# Patient Record
Sex: Female | Born: 1970 | Race: White | Hispanic: No | Marital: Married | Smoking: Never smoker
Health system: Southern US, Community
[De-identification: ages and names within clinical notes are randomized; demographics above are authoritative.]

## PROBLEM LIST (undated history)

## (undated) DIAGNOSIS — Z789 Other specified health status: Secondary | ICD-10-CM

## (undated) HISTORY — PX: ABDOMINAL HYSTERECTOMY: SHX81

---

## 2008-02-21 ENCOUNTER — Inpatient Hospital Stay (HOSPITAL_COMMUNITY): Admission: RE | Admit: 2008-02-21 | Discharge: 2008-02-23 | Payer: Self-pay | Admitting: Obstetrics and Gynecology

## 2008-02-21 ENCOUNTER — Encounter (INDEPENDENT_AMBULATORY_CARE_PROVIDER_SITE_OTHER): Payer: Self-pay | Admitting: Obstetrics and Gynecology

## 2010-03-05 ENCOUNTER — Ambulatory Visit (HOSPITAL_COMMUNITY)
Admission: RE | Admit: 2010-03-05 | Discharge: 2010-03-06 | Payer: Self-pay | Source: Home / Self Care | Admitting: Obstetrics and Gynecology

## 2010-03-05 ENCOUNTER — Encounter (INDEPENDENT_AMBULATORY_CARE_PROVIDER_SITE_OTHER): Payer: Self-pay | Admitting: Obstetrics and Gynecology

## 2010-08-21 LAB — CBC
HCT: 30.6 % — ABNORMAL LOW (ref 36.0–46.0)
HCT: 38.2 % (ref 36.0–46.0)
Hemoglobin: 13.2 g/dL (ref 12.0–15.0)
MCH: 32.5 pg (ref 26.0–34.0)
MCHC: 34.3 g/dL (ref 30.0–36.0)
MCHC: 34.6 g/dL (ref 30.0–36.0)
MCV: 95.3 fL (ref 78.0–100.0)
RDW: 12.6 % (ref 11.5–15.5)
RDW: 12.6 % (ref 11.5–15.5)

## 2010-08-21 LAB — COMPREHENSIVE METABOLIC PANEL
BUN: 3 mg/dL — ABNORMAL LOW (ref 6–23)
Calcium: 8.5 mg/dL (ref 8.4–10.5)
Glucose, Bld: 95 mg/dL (ref 70–99)
Total Protein: 5.6 g/dL — ABNORMAL LOW (ref 6.0–8.3)

## 2010-08-21 LAB — SURGICAL PCR SCREEN: Staphylococcus aureus: POSITIVE — AB

## 2010-08-21 LAB — PREGNANCY, URINE: Preg Test, Ur: NEGATIVE

## 2010-10-21 NOTE — Op Note (Signed)
NAMEERNEST, Sherri White NO.:  [id number]   MEDICAL RECORD NO.:  [id number]          PATIENT TYPE:  INP   LOCATION:  9148                          [facility]ILITY:  [facility]   PHYSICIAN:  Randye Lobo, M.D.   DATE OF BIRTH:  19-Jul-1970   DATE OF PROCEDURE:  02/21/2008  DATE OF DISCHARGE:                               OPERATIVE REPORT   PREOPERATIVE DIAGNOSES:  1. Intrauterine gestation at 24 plus 1 weeks.  2. History of prior cesarean section x2.  3. Multiparous female, desires permanent sterilization.   POSTOPERATIVE DIAGNOSES:  1. Intrauterine gestation at 83 plus 1 weeks.  2. History of prior cesarean section x2.  3. Multiparous female, desires permanent sterilization.   PROCEDURE:  Repeat low segment transverse cesarean section, bilateral  tubal ligation by the modified Pomeroy technique.   SURGEON:  Randye Lobo, MD   ASSISTANT:  Gretchen Short, PA-C   ANESTHESIA:  Spinal.   IV FLUIDS:  2100 mL Ringer's lactate.   ESTIMATED BLOOD LOSS:  600 mL.   URINE OUTPUT:  150 mL.   COMPLICATIONS:  None.   INDICATIONS FOR PROCEDURE:  The patient is a 40 year old gravida 3, para  2-0-0-2 Caucasian female, status post cesarean section x2, who  throughout her current pregnancy has requested a repeat cesarean section  and permanent sterilization.  The patient now presents at 39 plus 1  weeks and a plan is made to proceed.  Risks, benefits, and alternatives  have been discussed with the patient.  The patient was quoted a failure  rate of the tubal ligation of approximately 1 in 250 to 1 in 300 which  may result in either an intrauterine or ectopic pregnancy.   FINDINGS:  A viable female was delivered at 9:56 a.m.  Apgars were 9 at  1 minute and 9 at 5 minutes.  Weight was 7 pounds 14 ounces.  Amniotic  fluid clear.  Uterus, tubes and ovaries were normal.   SPECIMENS:  Portion of the right and left fallopian tubes were sent to  pathology.   PROCEDURE:  The patient was  reidentified in the preoperative hold area.  She did receive Ancef 1 g IV for antibiotic prophylaxis.   In the operating room, a spinal anesthetic was administered.  The  abdomen was then sterilely prepped and a Foley catheter was placed  inside the bladder.  She was sterilely draped.   A Pfannenstiel incision was created sharply with a scalpel.  The  incision was carried down to the fascia using a scalpel.  The fascia was  incised in the midline with a scalpel and the incision was extended with  the Mayo scissors.  The rectus muscles were then dissected off of the  fascia superiorly and inferiorly using sharp dissection.  The rectus  muscles were divided in the midline.  Entry directly into the peritoneal  cavity was performed with the separation of the rectus muscles, which  were densely adherent to the peritoneum.  A care was taken to avoid the  bladder as the peritoneal incision was extended inferiorly.  The lower uterine segment was exposed and the bladder flap was sharply  created.  A transverse lower uterine segment incision was then created  sharply with a scalpel and the incision was extended bluntly.  A hand  was inserted through the uterine incision and the vertex was noted to be  high.  Fundal pressure was applied which brought the vertex to the  uterine incision.  The Mityvac was used with proper pressure over 1  effort and the vertex was delivered without difficulty.  The vacuum was  released.  The nares and mouth were suctioned and the remainder of the  newborn was delivered.  The newborn was in vigorous condition.  The  umbilical cord was doubly clamped and cut and the newborn was carried  over to the awaiting pediatricians.   The placenta was manually extracted at this time and was set aside for  cord blood collection and donation.  The uterus was exteriorized.  It  was wiped, clean with a moistened lap pad.  The uterine incision was  closed with a double layer  closure of #1 chromic.  The first was a  running locked layer and the second was an imbricating layer.   The bilateral tubal ligation was performed next.  The right fallopian  tube was grasped along the isthmic portion.  Two free ties of 0 plain  gut suture were placed at the base of loop of fallopian tube.  The  intervening portion was sharply excised and sent to pathology.  The same  procedure that was performed on the right fallopian tube was repeated on  the left fallopian tube after that tube was similarly followed all the  way to its fimbriated end.  Again a portion of the left fallopian tube  was sent to pathology.  The tubal sites were examined and were noted to  ooze very slightly along the serosa of the tube which was cauterized for  excellent hemostasis.   The uterus was carefully returned to the peritoneal cavity.  The tubal  sites were intact.  The perineal cavity was then irrigated and  suctioned.  The uterine incision was hemostatic and the abdomen was  therefore closed.  Omental adhesions to the left anterior abdominal wall  peritoneum were lysed using monopolar cautery.  The parietal peritoneum  was closed with a running suture of 2-0 Vicryl.  The rectus muscles were  reapproximated in the midline with interrupted sutures of #1 chromic.  The fascia was then closed with a running suture of 0 Vicryl.  The  subcutaneous layer was irrigated and suctioned and made hemostatic with  monopolar cautery.  The subcutaneous layer was incised with the  monopolar cautery in the subcutaneous layer in order to decrease tension  on closure of the skin.  The skin was then closed with staples and  sterile dressing was placed over the incision.   This concluded the patient's procedure.  There were no complications.  All needle, instrument, and sponge counts were correct.  The patient was  escorted to the recovery room in stable and awake condition.      Randye Lobo, M.D.   Electronically Signed    BES/MEDQ  D:  02/21/2008  T:  02/21/2008  Job:  [id number]

## 2010-10-24 NOTE — Discharge Summary (Signed)
Sherri White, NOA NO.:  [id number]   MEDICAL RECORD NO.:  [id number]          PATIENT TYPE:  INP   LOCATION:  9148                          [facility]ILITY:  [facility]   PHYSICIAN:  Kendra H. Tenny Craw, MD     DATE OF BIRTH:  Aug 22, 1970   DATE OF ADMISSION:  02/21/2008  DATE OF DISCHARGE:  02/23/2008                               DISCHARGE SUMMARY   FINAL DIAGNOSES:  Intrauterine pregnancy at 37 weeks' gestation, history  of prior cesarean section x2, multiparous female desiring permanent  sterilization.   PROCEDURE:  Repeat low transverse cesarean section and bilateral tubal  ligation using the modified Pomeroy technique.   SURGEON:  Randye Lobo, MD   ASSISTANT:  Gretchen Short, PA-C   COMPLICATIONS:  None.   This 40 year old G3, P 2-0-0-2 presents at term for repeat cesarean  section.  The patient had had a prior cesarean section with her last 2  deliveries and desires repeat with this pregnancy as well.  The  patient's antepartum course otherwise up to this point has been  uncomplicated.  The patient has advanced maternal age and did have a  normal first trimester screen at the beginning of this pregnancy.  She  did decline amniocentesis.  The patient also has a known history of HSV-  2.   HOSPITAL COURSE:  She presents at 50 weeks' gestation, expresses her  desires for permanent sterilization to be performed after cesarean  section and risks and benefits  were discussed with the patient.  The  patient was taken to the operating room on February 21, 2008, by Dr.  Conley Simmonds where a repeat low segment transverse cesarean section was  performed with the delivery of a 7-pound 14-ounce female infant with  Apgars of 9 and 9.  Delivery went without complications.  The patient  still expressed her desire for permanent sterilization, which was  performed using the modified Pomeroy technique.  The procedure went  without complications.  The patient's postoperative course  was benign  without any significant fevers.  She was felt ready for discharge on  postoperative day #2, was sent home on a regular diet, told to decrease  activities, told to continue her vitamins, was given Percocet 1-2 every  4-6 hours as needed for pain, was to follow up the next day for her  staple removal.  Instructions and precautions were reviewed with the  patient.   The patient was also positive group B strep noted on her 35-week culture  and therefore, the patient did have a g of Ancef prior to her cesarean  section.   LABORATORY DATA:  On discharge, the patient had a hemoglobin of 12.0,  white blood cell count was 13.3, and platelets of 186,000.      Leilani Able, P.A.-C.      Freddrick March. Tenny Craw, MD  Electronically Signed    MB/MEDQ  D:  03/27/2008  T:  03/28/2008  Job:  [id number]

## 2011-03-09 LAB — URINALYSIS, ROUTINE W REFLEX MICROSCOPIC
Hgb urine dipstick: NEGATIVE
Nitrite: NEGATIVE
Specific Gravity, Urine: 1.02
pH: 6.5

## 2011-03-09 LAB — CBC
HCT: 40.5
Hemoglobin: 13.8
MCHC: 34
MCV: 97.2
RBC: 3.57 — ABNORMAL LOW
RBC: 4.16
WBC: 8

## 2011-03-09 LAB — URINE MICROSCOPIC-ADD ON

## 2013-09-29 ENCOUNTER — Other Ambulatory Visit: Payer: Self-pay | Admitting: Obstetrics and Gynecology

## 2013-10-03 ENCOUNTER — Other Ambulatory Visit: Payer: Self-pay | Admitting: Obstetrics and Gynecology

## 2013-10-03 DIAGNOSIS — R928 Other abnormal and inconclusive findings on diagnostic imaging of breast: Secondary | ICD-10-CM

## 2013-10-13 ENCOUNTER — Ambulatory Visit
Admission: RE | Admit: 2013-10-13 | Discharge: 2013-10-13 | Disposition: A | Discharge status: Home / Self Care | Payer: 59 | Source: Ambulatory Visit | Attending: Obstetrics and Gynecology | Admitting: Obstetrics and Gynecology

## 2013-10-13 DIAGNOSIS — R928 Other abnormal and inconclusive findings on diagnostic imaging of breast: Secondary | ICD-10-CM

## 2019-02-15 ENCOUNTER — Other Ambulatory Visit: Payer: Self-pay | Admitting: Neurosurgery

## 2019-02-17 ENCOUNTER — Other Ambulatory Visit: Payer: Self-pay

## 2019-02-17 ENCOUNTER — Other Ambulatory Visit (HOSPITAL_COMMUNITY)
Admission: RE | Admit: 2019-02-17 | Discharge: 2019-02-17 | Disposition: A | Discharge status: Home / Self Care | Payer: 59 | Source: Ambulatory Visit | Attending: Neurosurgery | Admitting: Neurosurgery

## 2019-02-17 DIAGNOSIS — Z20828 Contact with and (suspected) exposure to other viral communicable diseases: Secondary | ICD-10-CM | POA: Insufficient documentation

## 2019-02-17 DIAGNOSIS — Z01812 Encounter for preprocedural laboratory examination: Secondary | ICD-10-CM | POA: Diagnosis not present

## 2019-02-17 DIAGNOSIS — M5442 Lumbago with sciatica, left side: Secondary | ICD-10-CM | POA: Diagnosis not present

## 2019-02-18 LAB — NOVEL CORONAVIRUS, NAA (HOSP ORDER, SEND-OUT TO REF LAB; TAT 18-24 HRS): SARS-CoV-2, NAA: NOT DETECTED

## 2019-02-19 ENCOUNTER — Other Ambulatory Visit: Payer: Self-pay

## 2019-02-19 ENCOUNTER — Encounter (HOSPITAL_COMMUNITY): Payer: Self-pay | Admitting: *Deleted

## 2019-02-19 NOTE — Progress Notes (Signed)
Pre-op phone call complete.  PCP is Ronny Flurry, FNP in [employer].   Denies fever, CP, SOB, cough, loss taste/smell, no cardiologist.  Instructed NPO after MN, no lotions/deododorant/perfume, no valuable, no jewelry, no shaving 48 hours prior to surgery, no vitamins/NSAIDS/fish oil.  Verbalized understanding of instructions, all questions answered.

## 2019-02-20 ENCOUNTER — Other Ambulatory Visit: Payer: Self-pay | Admitting: Neurosurgery

## 2019-02-20 NOTE — H&P (Signed)
Chief Complaint   Back pain, left leg pain  HPI   HPI: Sherri White is a 48 y.o. female with several month history of back and left leg pain. She underwent an MRI of her lumbar spine which revealed a large left eccentric disc herniation at L4-5. Given severity of pain and interference with ADLs, it was recommended she undergo surgical intervention. She presents today for microdiscectomy. She is without any concerns.  There are no active problems to display for this patient.   PMH: Past Medical History:  Diagnosis Date  . Medical history non-contributory     PSH: Past Surgical History:  Procedure Laterality Date  . ABDOMINAL HYSTERECTOMY    . CESAREAN SECTION     x3    No medications prior to admission.    SH: Social History   Tobacco Use  . Smoking status: Never Smoker  . Smokeless tobacco: Never Used  Substance Use Topics  . Alcohol use: Yes    Comment: occasional  . Drug use: Never    MEDS: Prior to Admission medications   Medication Sig Start Date End Date Taking? Authorizing Provider  acetaminophen (TYLENOL) 500 MG tablet Take 1,000 mg by mouth every 8 (eight) hours as needed for moderate pain.   Yes [provider]  baclofen (LIORESAL) 10 MG tablet Take 10 mg by mouth 3 (three) times daily.   Yes [provider]  diclofenac (VOLTAREN) 75 MG EC tablet Take 75 mg by mouth 2 (two) times daily.   Yes [provider]  magnesium oxide (MAG-OX) 400 MG tablet Take 400 mg by mouth daily.   Yes [provider]    ALLERGY: No Known Allergies  Social History   Tobacco Use  . Smoking status: Never Smoker  . Smokeless tobacco: Never Used  Substance Use Topics  . Alcohol use: Yes    Comment: occasional     History reviewed. No pertinent family history.   ROS   ROS  Exam   There were no vitals filed for this visit. General appearance: WDWN, NAD Eyes: No scleral injection Cardiovascular: Regular rate and rhythm  without murmurs, rubs, gallops. No edema or variciosities. Distal pulses normal. Pulmonary: Effort normal, non-labored breathing Musculoskeletal:     Muscle tone upper extremities: Normal    Muscle tone lower extremities: Normal    Motor exam: Upper Extremities Deltoid Bicep Tricep Grip  Right 5/5 5/5 5/5 5/5  Left 5/5 5/5 5/5 5/5   Lower Extremity IP Quad PF DF EHL  Right 5/5 5/5 5/5 5/5 5/5  Left 5/5 5/5 5/5 5/5 5/5   Neurological Mental Status:    - Patient is awake, alert, oriented to person, place, month, year, and situation    - Patient is able to give a clear and coherent history.    - No signs of aphasia or neglect Cranial Nerves    - II: Visual Fields are full. PERRL    - III/IV/VI: EOMI without ptosis or diploplia.     - V: Facial sensation is grossly normal    - VII: Facial movement is symmetric.     - VIII: hearing is intact to voice    - X: Uvula elevates symmetrically    - XI: Shoulder shrug is symmetric.    - XII: tongue is midline without atrophy or fasciculations.  Sensory: Sensation grossly intact to LT  Results - Imaging/Labs   No results found for this or any previous visit (from the past 48 hour(s)).  No results found.  IMAGING: MRI of the lumbar spine dated 02/09/2019 was reviewed.  This demonstrates primary finding at L4-5 where there is a left eccentric relatively large disc herniation with likely compression of the traversing left L5 nerve root.  Impression/Plan   48 y.o. female with severe progressive left leg pain over the last 2 months secondary to a  large left eccentric disc herniation at L4-5.  we will proceed  with left-sided laminotomy and microdiskectomy at L4-5.  While in the office risks, benefits and alternatives were discussed. Patient stated understanding and wished to proceed.  Cindra PresumeVincent Costella, PA-C [facility]

## 2019-02-21 ENCOUNTER — Ambulatory Visit (HOSPITAL_COMMUNITY): Payer: 59

## 2019-02-21 ENCOUNTER — Encounter (HOSPITAL_COMMUNITY)
Admission: RE | Disposition: A | Discharge status: Home / Self Care | Payer: Self-pay | Source: Home / Self Care | Attending: Neurosurgery

## 2019-02-21 ENCOUNTER — Encounter (HOSPITAL_COMMUNITY): Payer: Self-pay | Admitting: *Deleted

## 2019-02-21 ENCOUNTER — Ambulatory Visit (HOSPITAL_COMMUNITY)
Admission: RE | Admit: 2019-02-21 | Discharge: 2019-02-21 | Disposition: A | Discharge status: Home / Self Care | Payer: 59 | Attending: Neurosurgery | Admitting: Neurosurgery

## 2019-02-21 ENCOUNTER — Other Ambulatory Visit: Payer: Self-pay

## 2019-02-21 DIAGNOSIS — Z79899 Other long term (current) drug therapy: Secondary | ICD-10-CM | POA: Insufficient documentation

## 2019-02-21 DIAGNOSIS — Z419 Encounter for procedure for purposes other than remedying health state, unspecified: Secondary | ICD-10-CM

## 2019-02-21 DIAGNOSIS — M5116 Intervertebral disc disorders with radiculopathy, lumbar region: Secondary | ICD-10-CM | POA: Insufficient documentation

## 2019-02-21 DIAGNOSIS — M5126 Other intervertebral disc displacement, lumbar region: Secondary | ICD-10-CM | POA: Diagnosis present

## 2019-02-21 DIAGNOSIS — Z791 Long term (current) use of non-steroidal anti-inflammatories (NSAID): Secondary | ICD-10-CM | POA: Insufficient documentation

## 2019-02-21 HISTORY — PX: LUMBAR LAMINECTOMY/DECOMPRESSION MICRODISCECTOMY: SHX5026

## 2019-02-21 HISTORY — DX: Other specified health status: Z78.9

## 2019-02-21 LAB — CBC
HCT: 40.1 % (ref 36.0–46.0)
Hemoglobin: 14.3 g/dL (ref 12.0–15.0)
MCH: 32.8 pg (ref 26.0–34.0)
MCHC: 35.7 g/dL (ref 30.0–36.0)
MCV: 92 fL (ref 80.0–100.0)
Platelets: 248 10*3/uL (ref 150–400)
RBC: 4.36 MIL/uL (ref 3.87–5.11)
RDW: 12.3 % (ref 11.5–15.5)
WBC: 6.6 10*3/uL (ref 4.0–10.5)
nRBC: 0 % (ref 0.0–0.2)

## 2019-02-21 SURGERY — LUMBAR LAMINECTOMY/DECOMPRESSION MICRODISCECTOMY 1 LEVEL
Anesthesia: General | Site: Spine Lumbar

## 2019-02-21 MED ORDER — METHYLPREDNISOLONE ACETATE 80 MG/ML IJ SUSP
INTRAMUSCULAR | Status: AC
  Filled 2019-02-21: qty 1

## 2019-02-21 MED ORDER — SUGAMMADEX SODIUM 200 MG/2ML IV SOLN
INTRAVENOUS | Status: DC | PRN
  Administered 2019-02-21: 354 mg via INTRAVENOUS

## 2019-02-21 MED ORDER — MIDAZOLAM HCL 2 MG/2ML IJ SOLN
INTRAMUSCULAR | Status: DC | PRN
  Administered 2019-02-21: 2 mg via INTRAVENOUS

## 2019-02-21 MED ORDER — EPHEDRINE 5 MG/ML INJ
INTRAVENOUS | Status: AC
  Filled 2019-02-21: qty 10

## 2019-02-21 MED ORDER — CEFAZOLIN SODIUM-DEXTROSE 2-3 GM-%(50ML) IV SOLR
INTRAVENOUS | Status: DC | PRN
  Administered 2019-02-21: 2 g via INTRAVENOUS

## 2019-02-21 MED ORDER — CEFAZOLIN SODIUM-DEXTROSE 2-4 GM/100ML-% IV SOLN
INTRAVENOUS | Status: AC
  Filled 2019-02-21: qty 100

## 2019-02-21 MED ORDER — FENTANYL CITRATE (PF) 100 MCG/2ML IJ SOLN: 25.0000 ug | INTRAMUSCULAR | Status: DC | PRN

## 2019-02-21 MED ORDER — THROMBIN 5000 UNITS EX SOLR
OROMUCOSAL | Status: DC | PRN
  Administered 2019-02-21: 13:00:00 5 mL via TOPICAL

## 2019-02-21 MED ORDER — THROMBIN 5000 UNITS EX SOLR
CUTANEOUS | Status: AC
  Filled 2019-02-21: qty 5000

## 2019-02-21 MED ORDER — THROMBIN 5000 UNITS EX SOLR
CUTANEOUS | Status: DC | PRN
  Administered 2019-02-21 (×2): 5000 [IU] via TOPICAL

## 2019-02-21 MED ORDER — ONDANSETRON HCL 4 MG/2ML IJ SOLN
INTRAMUSCULAR | Status: AC
  Filled 2019-02-21: qty 2

## 2019-02-21 MED ORDER — KETOROLAC TROMETHAMINE 30 MG/ML IJ SOLN
INTRAMUSCULAR | Status: DC | PRN
  Administered 2019-02-21: 30 mg via INTRAVENOUS

## 2019-02-21 MED ORDER — ROCURONIUM BROMIDE 10 MG/ML (PF) SYRINGE
PREFILLED_SYRINGE | INTRAVENOUS | Status: DC | PRN
  Administered 2019-02-21: 20 mg via INTRAVENOUS
  Administered 2019-02-21: 50 mg via INTRAVENOUS

## 2019-02-21 MED ORDER — 0.9 % SODIUM CHLORIDE (POUR BTL) OPTIME
TOPICAL | Status: DC | PRN
  Administered 2019-02-21: 1000 mL

## 2019-02-21 MED ORDER — PROPOFOL 10 MG/ML IV BOLUS
INTRAVENOUS | Status: DC | PRN
  Administered 2019-02-21: 150 mg via INTRAVENOUS

## 2019-02-21 MED ORDER — LIDOCAINE 2% (20 MG/ML) 5 ML SYRINGE
INTRAMUSCULAR | Status: DC | PRN
  Administered 2019-02-21: 60 mg via INTRAVENOUS

## 2019-02-21 MED ORDER — DEXAMETHASONE SODIUM PHOSPHATE 10 MG/ML IJ SOLN
INTRAMUSCULAR | Status: AC
  Filled 2019-02-21: qty 1

## 2019-02-21 MED ORDER — ROCURONIUM BROMIDE 10 MG/ML (PF) SYRINGE
PREFILLED_SYRINGE | INTRAVENOUS | Status: AC
  Filled 2019-02-21: qty 10

## 2019-02-21 MED ORDER — THROMBIN 5000 UNITS EX SOLR
CUTANEOUS | Status: AC
  Filled 2019-02-21: qty 10000

## 2019-02-21 MED ORDER — LIDOCAINE-EPINEPHRINE 1 %-1:100000 IJ SOLN
INTRAMUSCULAR | Status: DC | PRN
  Administered 2019-02-21: 5 mL

## 2019-02-21 MED ORDER — SODIUM CHLORIDE 0.9 % IV SOLN
INTRAVENOUS | Status: DC | PRN
  Administered 2019-02-21: 500 mL

## 2019-02-21 MED ORDER — FENTANYL CITRATE (PF) 250 MCG/5ML IJ SOLN
INTRAMUSCULAR | Status: AC
  Filled 2019-02-21: qty 5

## 2019-02-21 MED ORDER — PROPOFOL 10 MG/ML IV BOLUS
INTRAVENOUS | Status: AC
  Filled 2019-02-21: qty 40

## 2019-02-21 MED ORDER — HEMOSTATIC AGENTS (NO CHARGE) OPTIME
TOPICAL | Status: DC | PRN
  Administered 2019-02-21: 1 via TOPICAL

## 2019-02-21 MED ORDER — ONDANSETRON HCL 4 MG/2ML IJ SOLN
INTRAMUSCULAR | Status: DC | PRN
  Administered 2019-02-21: 4 mg via INTRAVENOUS

## 2019-02-21 MED ORDER — GLYCOPYRROLATE PF 0.2 MG/ML IJ SOSY
PREFILLED_SYRINGE | INTRAMUSCULAR | Status: AC
  Filled 2019-02-21: qty 1

## 2019-02-21 MED ORDER — DEXAMETHASONE SODIUM PHOSPHATE 10 MG/ML IJ SOLN
INTRAMUSCULAR | Status: DC | PRN
  Administered 2019-02-21: 10 mg via INTRAVENOUS

## 2019-02-21 MED ORDER — BUPIVACAINE HCL (PF) 0.5 % IJ SOLN
INTRAMUSCULAR | Status: AC
  Filled 2019-02-21: qty 30

## 2019-02-21 MED ORDER — CEFAZOLIN SODIUM-DEXTROSE 2-4 GM/100ML-% IV SOLN
2.0000 g | INTRAVENOUS | Status: AC
  Administered 2019-02-21: 2 g via INTRAVENOUS

## 2019-02-21 MED ORDER — BUPIVACAINE HCL (PF) 0.5 % IJ SOLN
INTRAMUSCULAR | Status: DC | PRN
  Administered 2019-02-21: 5 mL

## 2019-02-21 MED ORDER — MIDAZOLAM HCL 2 MG/2ML IJ SOLN
INTRAMUSCULAR | Status: AC
  Filled 2019-02-21: qty 2

## 2019-02-21 MED ORDER — LIDOCAINE 2% (20 MG/ML) 5 ML SYRINGE
INTRAMUSCULAR | Status: AC
  Filled 2019-02-21: qty 5

## 2019-02-21 MED ORDER — OXYCODONE-ACETAMINOPHEN 5-325 MG PO TABS: 1.0000 | ORAL_TABLET | ORAL | 0 refills | Status: AC | PRN

## 2019-02-21 MED ORDER — CHLORHEXIDINE GLUCONATE CLOTH 2 % EX PADS: 6.0000 | MEDICATED_PAD | Freq: Once | CUTANEOUS | Status: DC

## 2019-02-21 MED ORDER — METHYLPREDNISOLONE ACETATE 80 MG/ML IJ SUSP
INTRAMUSCULAR | Status: DC | PRN
  Administered 2019-02-21: 80 mg

## 2019-02-21 MED ORDER — FENTANYL CITRATE (PF) 250 MCG/5ML IJ SOLN
INTRAMUSCULAR | Status: DC | PRN
  Administered 2019-02-21: 50 ug via INTRAVENOUS
  Administered 2019-02-21: 100 ug via INTRAVENOUS
  Administered 2019-02-21 (×2): 50 ug via INTRAVENOUS

## 2019-02-21 MED ORDER — LIDOCAINE-EPINEPHRINE 1 %-1:100000 IJ SOLN
INTRAMUSCULAR | Status: AC
  Filled 2019-02-21: qty 1

## 2019-02-21 MED ORDER — PROMETHAZINE HCL 25 MG/ML IJ SOLN: 6.2500 mg | INTRAMUSCULAR | Status: DC | PRN

## 2019-02-21 MED ORDER — LACTATED RINGERS IV SOLN
INTRAVENOUS | Status: DC
  Administered 2019-02-21 (×2): via INTRAVENOUS

## 2019-02-21 SURGICAL SUPPLY — 63 items
BAG DECANTER FOR FLEXI CONT (MISCELLANEOUS) ×3 IMPLANT
BENZOIN TINCTURE PRP APPL 2/3 (GAUZE/BANDAGES/DRESSINGS) IMPLANT
BLADE CLIPPER SURG (BLADE) IMPLANT
BLADE SURG 11 STRL SS (BLADE) ×3 IMPLANT
BUR MATCHSTICK NEURO 3.0 LAGG (BURR) ×2 IMPLANT
BUR PRECISION FLUTE 5.0 (BURR) IMPLANT
CANISTER SUCT 3000ML PPV (MISCELLANEOUS) ×3 IMPLANT
CARTRIDGE OIL MAESTRO DRILL (MISCELLANEOUS) ×1 IMPLANT
CLOSURE WOUND 1/2 X4 (GAUZE/BANDAGES/DRESSINGS)
COVER WAND RF STERILE (DRAPES) ×1 IMPLANT
DECANTER SPIKE VIAL GLASS SM (MISCELLANEOUS) ×3 IMPLANT
DERMABOND ADVANCED (GAUZE/BANDAGES/DRESSINGS) ×2
DERMABOND ADVANCED .7 DNX12 (GAUZE/BANDAGES/DRESSINGS) ×1 IMPLANT
DIFFUSER DRILL AIR PNEUMATIC (MISCELLANEOUS) ×3 IMPLANT
DRAPE LAPAROTOMY 100X72X124 (DRAPES) ×3 IMPLANT
DRAPE MICROSCOPE LEICA (MISCELLANEOUS) ×3 IMPLANT
DRAPE SURG 17X23 STRL (DRAPES) ×3 IMPLANT
DRSG OPSITE POSTOP 3X4 (GAUZE/BANDAGES/DRESSINGS) ×3 IMPLANT
DURAPREP 26ML APPLICATOR (WOUND CARE) ×3 IMPLANT
ELECT REM PT RETURN 9FT ADLT (ELECTROSURGICAL) ×3
ELECTRODE REM PT RTRN 9FT ADLT (ELECTROSURGICAL) ×1 IMPLANT
GAUZE 4X4 16PLY RFD (DISPOSABLE) IMPLANT
GAUZE SPONGE 4X4 12PLY STRL (GAUZE/BANDAGES/DRESSINGS) IMPLANT
GLOVE BIO SURGEON STRL SZ7 (GLOVE) IMPLANT
GLOVE BIO SURGEON STRL SZ7.5 (GLOVE) ×2 IMPLANT
GLOVE BIOGEL PI IND STRL 6.5 (GLOVE) IMPLANT
GLOVE BIOGEL PI IND STRL 7.0 (GLOVE) IMPLANT
GLOVE BIOGEL PI IND STRL 7.5 (GLOVE) ×1 IMPLANT
GLOVE BIOGEL PI INDICATOR 6.5 (GLOVE) ×2
GLOVE BIOGEL PI INDICATOR 7.0 (GLOVE)
GLOVE BIOGEL PI INDICATOR 7.5 (GLOVE) ×4
GLOVE ECLIPSE 7.0 STRL STRAW (GLOVE) ×3 IMPLANT
GLOVE EXAM NITRILE XL STR (GLOVE) IMPLANT
GLOVE SURG SS PI 6.0 STRL IVOR (GLOVE) ×4 IMPLANT
GOWN STRL REUS W/ TWL LRG LVL3 (GOWN DISPOSABLE) ×2 IMPLANT
GOWN STRL REUS W/ TWL XL LVL3 (GOWN DISPOSABLE) IMPLANT
GOWN STRL REUS W/TWL 2XL LVL3 (GOWN DISPOSABLE) IMPLANT
GOWN STRL REUS W/TWL LRG LVL3 (GOWN DISPOSABLE) ×8
GOWN STRL REUS W/TWL XL LVL3 (GOWN DISPOSABLE)
HEMOSTAT POWDER KIT SURGIFOAM (HEMOSTASIS) ×3 IMPLANT
KIT BASIN OR (CUSTOM PROCEDURE TRAY) ×3 IMPLANT
KIT TURNOVER KIT B (KITS) ×3 IMPLANT
NDL HYPO 18GX1.5 BLUNT FILL (NEEDLE) IMPLANT
NDL SPNL 18GX3.5 QUINCKE PK (NEEDLE) IMPLANT
NEEDLE HYPO 18GX1.5 BLUNT FILL (NEEDLE) ×3 IMPLANT
NEEDLE HYPO 22GX1.5 SAFETY (NEEDLE) ×3 IMPLANT
NEEDLE SPNL 18GX3.5 QUINCKE PK (NEEDLE) ×3 IMPLANT
NS IRRIG 1000ML POUR BTL (IV SOLUTION) ×3 IMPLANT
OIL CARTRIDGE MAESTRO DRILL (MISCELLANEOUS) ×3
PACK LAMINECTOMY NEURO (CUSTOM PROCEDURE TRAY) ×3 IMPLANT
PAD ARMBOARD 7.5X6 YLW CONV (MISCELLANEOUS) ×13 IMPLANT
RUBBERBAND STERILE (MISCELLANEOUS) ×6 IMPLANT
SPONGE LAP 4X18 RFD (DISPOSABLE) IMPLANT
SPONGE SURGIFOAM ABS GEL SZ50 (HEMOSTASIS) ×3 IMPLANT
STRIP CLOSURE SKIN 1/2X4 (GAUZE/BANDAGES/DRESSINGS) IMPLANT
SUT VIC AB 0 CT1 18XCR BRD8 (SUTURE) ×1 IMPLANT
SUT VIC AB 0 CT1 8-18 (SUTURE) ×2
SUT VIC AB 2-0 CT1 18 (SUTURE) IMPLANT
SUT VICRYL 3-0 RB1 18 ABS (SUTURE) ×4 IMPLANT
SYR 3ML LL SCALE MARK (SYRINGE) ×2 IMPLANT
TOWEL GREEN STERILE (TOWEL DISPOSABLE) ×3 IMPLANT
TOWEL GREEN STERILE FF (TOWEL DISPOSABLE) ×3 IMPLANT
WATER STERILE IRR 1000ML POUR (IV SOLUTION) ×3 IMPLANT

## 2019-02-21 NOTE — H&P (Signed)
Chief Complaint   Back pain, left leg pain  HPI   HPI: Sherri White is a 48 y.o. female with several month history of back and left leg pain. She underwent an MRI of her lumbar spine which revealed a large left eccentric disc herniation at L4-5. Given severity of pain and interference with ADLs, it was recommended she undergo surgical intervention. She presents today for microdiscectomy. She is without any concerns.  There are no active problems to display for this patient.   PMH: Past Medical History:  Diagnosis Date  . Medical history non-contributory     PSH: Past Surgical History:  Procedure Laterality Date  . ABDOMINAL HYSTERECTOMY    . CESAREAN SECTION     x3    Medications Prior to Admission  Medication Sig Dispense Refill Last Dose  . acetaminophen (TYLENOL) 500 MG tablet Take 1,000 mg by mouth every 8 (eight) hours as needed for moderate pain.   02/21/2019 at 0600  . baclofen (LIORESAL) 10 MG tablet Take 10 mg by mouth 3 (three) times daily.   02/21/2019 at 0200  . diclofenac (VOLTAREN) 75 MG EC tablet Take 75 mg by mouth 2 (two) times daily.   Past Week at Unknown time  . magnesium oxide (MAG-OX) 400 MG tablet Take 400 mg by mouth daily.   Past Week at Unknown time    SH: Social History   Tobacco Use  . Smoking status: Never Smoker  . Smokeless tobacco: Never Used  Substance Use Topics  . Alcohol use: Yes    Comment: occasional  . Drug use: Never    MEDS: Prior to Admission medications   Medication Sig Start Date End Date Taking? Authorizing Provider  acetaminophen (TYLENOL) 500 MG tablet Take 1,000 mg by mouth every 8 (eight) hours as needed for moderate pain.   Yes [provider]  baclofen (LIORESAL) 10 MG tablet Take 10 mg by mouth 3 (three) times daily.   Yes [provider]  diclofenac (VOLTAREN) 75 MG EC tablet Take 75 mg by mouth 2 (two) times daily.   Yes [provider]  magnesium oxide (MAG-OX) 400 MG tablet Take  400 mg by mouth daily.   Yes [provider]    ALLERGY: No Known Allergies  Social History   Tobacco Use  . Smoking status: Never Smoker  . Smokeless tobacco: Never Used  Substance Use Topics  . Alcohol use: Yes    Comment: occasional     History reviewed. No pertinent family history.   ROS   ROS  Exam   Vitals:   02/21/19 0939  BP: 120/81  Pulse: 76  Resp: 20  Temp: 97.7 F (36.5 C)  SpO2: 97%   General appearance: WDWN, NAD Eyes: No scleral injection Cardiovascular: Regular rate and rhythm without murmurs, rubs, gallops. No edema or variciosities. Distal pulses normal. Pulmonary: Effort normal, non-labored breathing Musculoskeletal:     Muscle tone upper extremities: Normal    Muscle tone lower extremities: Normal    Motor exam: Upper Extremities Deltoid Bicep Tricep Grip  Right 5/5 5/5 5/5 5/5  Left 5/5 5/5 5/5 5/5   Lower Extremity IP Quad PF DF EHL  Right 5/5 5/5 5/5 5/5 5/5  Left 5/5 5/5 5/5 5/5 5/5   Neurological Mental Status:    - Patient is awake, alert, oriented to person, place, month, year, and situation    - Patient is able to give a clear and coherent history.    -  No signs of aphasia or neglect Cranial Nerves    - II: Visual Fields are full. PERRL    - III/IV/VI: EOMI without ptosis or diploplia.     - V: Facial sensation is grossly normal    - VII: Facial movement is symmetric.     - VIII: hearing is intact to voice    - X: Uvula elevates symmetrically    - XI: Shoulder shrug is symmetric.    - XII: tongue is midline without atrophy or fasciculations.  Sensory: Sensation grossly intact to LT  Results - Imaging/Labs   Results for orders placed or performed during the hospital encounter of 02/21/19 (from the past 48 hour(s))  CBC     Status: None   Collection Time: 02/21/19  9:52 AM  Result Value Ref Range   WBC 6.6 4.0 - 10.5 K/uL   RBC 4.36 3.87 - 5.11 MIL/uL   Hemoglobin 14.3 12.0 - 15.0 g/dL   HCT 40.1 36.0 - 46.0  %   MCV 92.0 80.0 - 100.0 fL   MCH 32.8 26.0 - 34.0 pg   MCHC 35.7 30.0 - 36.0 g/dL   RDW 12.3 11.5 - 15.5 %   Platelets 248 150 - 400 K/uL   nRBC 0.0 0.0 - 0.2 %    Comment: Performed at [facility], [place] [address], [place], [place] [address]    No results found.  IMAGING: MRI of the lumbar spine dated 02/09/2019 was reviewed.  This demonstrates primary finding at L4-5 where there is a left eccentric relatively large disc herniation with likely compression of the traversing left L5 nerve root.  Impression/Plan   48 y.o. female with severe progressive left leg pain over the last 2 months secondary to a  large left eccentric disc herniation at L4-5.  we will proceed  with left-sided laminotomy and microdiskectomy at L4-5.  I have reviewed the indications, risks, benefits, and alternatives to surgery with the patient in the office. All questions today were answered and consent was obtained.  Consuella Lose, MD [facility]

## 2019-02-21 NOTE — Anesthesia Procedure Notes (Signed)
Procedure Name: MAC Date/Time: 02/21/2019 12:07 PM Performed by: Verdie Drown, CRNA Pre-anesthesia Checklist: Patient identified, Emergency Drugs available, Suction available, Patient being monitored and Timeout performed Patient Re-evaluated:Patient Re-evaluated prior to induction Oxygen Delivery Method: Circle system utilized Preoxygenation: Pre-oxygenation with 100% oxygen Induction Type: IV induction Ventilation: Mask ventilation without difficulty Laryngoscope Size: Miller and 2 Grade View: Grade I Tube type: Oral Tube size: 7.0 mm Number of attempts: 1 Airway Equipment and Method: Stylet Placement Confirmation: ETT inserted through vocal cords under direct vision and positive ETCO2 Secured at: 22 cm Tube secured with: Tape Dental Injury: Teeth and Oropharynx as per pre-operative assessment

## 2019-02-21 NOTE — Anesthesia Postprocedure Evaluation (Signed)
Anesthesia Post Note  Patient: Sherri White  Procedure(s) Performed: MICRODISCECTOMY LUMBAR FOUR- LUMBAR FIVE (N/A Spine Lumbar)     Patient location during evaluation: PACU Anesthesia Type: General Level of consciousness: awake and alert Pain management: pain level controlled Vital Signs Assessment: post-procedure vital signs reviewed and stable Respiratory status: spontaneous breathing, nonlabored ventilation, respiratory function stable and patient connected to nasal cannula oxygen Cardiovascular status: blood pressure returned to baseline and stable Postop Assessment: no apparent nausea or vomiting Anesthetic complications: no    Last Vitals:  Vitals:   02/21/19 1310 02/21/19 1329  BP: 115/70 113/67  Pulse:    Resp: 12   Temp: (!) 36.1 C   SpO2:      Last Pain:  Vitals:   02/21/19 1326  TempSrc:   PainSc: 2                  Tiajuana Amass

## 2019-02-21 NOTE — Anesthesia Preprocedure Evaluation (Signed)
Anesthesia Evaluation  Patient identified by MRN, date of birth, ID band Patient awake    Reviewed: Allergy & Precautions, NPO status , Patient's Chart, lab work & pertinent test results  Airway Mallampati: I  TM Distance: >3 FB Neck ROM: Full    Dental  (+) Dental Advisory Given   Pulmonary neg pulmonary ROS,    breath sounds clear to auscultation       Cardiovascular negative cardio ROS   Rhythm:Regular Rate:Normal     Neuro/Psych negative neurological ROS     GI/Hepatic negative GI ROS, Neg liver ROS,   Endo/Other  negative endocrine ROS  Renal/GU negative Renal ROS     Musculoskeletal   Abdominal   Peds  Hematology negative hematology ROS (+)   Anesthesia Other Findings   Reproductive/Obstetrics                             Anesthesia Physical Anesthesia Plan  ASA: I  Anesthesia Plan: General   Post-op Pain Management:    Induction: Intravenous  PONV Risk Score and Plan: 3 and Midazolam, Dexamethasone, Ondansetron and Treatment may vary due to age or medical condition  Airway Management Planned: Oral ETT  Additional Equipment:   Intra-op Plan:   Post-operative Plan: Extubation in OR  Informed Consent: I have reviewed the patients History and Physical, chart, labs and discussed the procedure including the risks, benefits and alternatives for the proposed anesthesia with the patient or authorized representative who has indicated his/her understanding and acceptance.     Dental advisory given  Plan Discussed with: CRNA  Anesthesia Plan Comments:         Anesthesia Quick Evaluation

## 2019-02-21 NOTE — Discharge Summary (Signed)
  Physician Discharge Summary  Patient ID: Sherri White MRN: [id number] DOB/AGE: [birth year] 48 y.o.  Admit date: 02/21/2019 Discharge date: 02/21/2019  Admission Diagnoses: Lumbar disc herniation with radiculopathy, left L4-5  Discharge Diagnoses: Same Active Problems:   * No active hospital problems. *   Discharged Condition: Stable  Hospital Course:  Sherri White is a 48 y.o. female who presented to the clinic with left L5 radiculopathy and MRI demonstrating left L45 disc herniation. The patient underwent elective left L4-5 laminotomy and microdiscectomy which was done without complication. Postop the patient was at her neurologic baseline, reporting relief of leg pain. Back pain was controlled with oral medication, she was ambulating without difficulty, voiding normally, and tolerating diet.  Treatments: Surgery - left L4-5 laminotomy, microdiscectomy  Discharge Exam: Blood pressure (P) 115/70, pulse 76, temperature (!) (P) 97 F (36.1 C), resp. rate (P) 12, height 5\' 7"  (1.702 m), weight 88.5 kg, SpO2 97 %. Awake, alert, oriented Speech fluent, appropriate CN grossly intact 5/5 BUE/BLE Wound c/d/i  Follow-up: Follow-up in my office [facility] [phone]) in 2-3 weeks  Disposition: Discharge disposition: 01-Home or Self Care       Discharge Instructions    Call MD for:  redness, tenderness, or signs of infection (pain, swelling, redness, odor or green/yellow discharge around incision site)   Complete by: As directed    Call MD for:  temperature >100.4   Complete by: As directed    Diet - low sodium heart healthy   Complete by: As directed    Discharge instructions   Complete by: As directed    Walk at home as much as possible, at least 4 times / day   Increase activity slowly   Complete by: As directed    Lifting restrictions   Complete by: As directed    No lifting > 10 lbs   May shower / Bathe   Complete by: As directed    48  hours after surgery   May walk up steps   Complete by: As directed    No dressing needed   Complete by: As directed    Other Restrictions   Complete by: As directed    No bending/twisting at waist     Allergies as of 02/21/2019   No Known Allergies     Medication List    TAKE these medications   acetaminophen 500 MG tablet Commonly known as: TYLENOL Take 1,000 mg by mouth every 8 (eight) hours as needed for moderate pain.   baclofen 10 MG tablet Commonly known as: LIORESAL Take 10 mg by mouth 3 (three) times daily.   diclofenac 75 MG EC tablet Commonly known as: VOLTAREN Take 75 mg by mouth 2 (two) times daily.   magnesium oxide 400 MG tablet Commonly known as: MAG-OX Take 400 mg by mouth daily.   oxyCODONE-acetaminophen 5-325 MG tablet Commonly known as: Percocet Take 1 tablet by mouth every 4 (four) hours as needed for severe pain.      Follow-up Information    Consuella Lose, MD In 2 weeks.   Specialty: Neurosurgery Contact information: [address] [place] [address] [phone]           Signed: Jairo Ben 02/21/2019, 1:12 PM

## 2019-02-21 NOTE — Op Note (Signed)
°  NEUROSURGERY OPERATIVE NOTE   PREOP DIAGNOSIS: Lumbar disc herniation, Left L4-5  POSTOP DIAGNOSIS: Same  PROCEDURE: 1. Left L4-5 laminotomy and microdiscectomy for decompression of nerve root 2. Use of operating microscope  SURGEON: Dr. Consuella Lose, MD  ASSISTANT: Ferne Reus, PA-C  ANESTHESIA: General Endotracheal  EBL: 50cc  SPECIMENS: None  DRAINS: none  COMPLICATIONS: none immediate  CONDITION: hemodynamically stable to PACU  HISTORY: Sherri White is a 48 y.o. female initially presented to the outpatient neurosurgery clinic with progressively worsening left-sided back and leg pain.  Her MRI did demonstrate a large left eccentric L4-5 disc herniation with compression of the traversing left L5 nerve root.  Pain became significant enough that she was barely ambulatory.  Treatment options were discussed and she elected to proceed with surgical decompression.  The risks and benefits of the surgery were reviewed in detail with the patient and her husband.  After all questions were answered informed consent was obtained and witnessed.  PROCEDURE IN DETAIL: After informed consent was obtained and witnessed, the patient was brought to the operating room. After induction of general anesthesia, the patient was positioned on the operative table in the prone position with all pressure points meticulously padded. The skin of the low back was then prepped and draped in the usual sterile fashion.  After timeout was conducted, spinal needle was introduced to identify the surface projection of the L4-5 interspace.  The skin was infiltrated with local anesthetic. Skin incision was then made sharply and Bovie electrocautery was used to dissect the subcutaneous tissue until the lumbodorsal fascia was identified. The fashion was then incised using Bovie electrocautery and the lamina at the L4 on the left was identified and dissection was carried out in the subperiosteal plane.  Self-retaining retractor was then placed, and intraoperative x-ray was taken to confirm we were at the correct level.  Using a high-speed drill, laminotomy was completed with a partial medial facetectomy. The ligamentum flavum was then identified and removed and the lateral edge of the thecal sac was identified. This was then traced down to identify the traversing nerve root. Dissection was then carried out lateral to the nerve root to identify the disc herniation.  Utilizing a ball-tipped dissector, I was able to identify the superior and inferior margins of the disc herniation in the ventral epidural space.  I was then gently able to dissect the ventral dura off the herniated disc.  At this point, a pituitary rongeur was used to remove the large herniated disc fragment and a single piece.  Once this was removed, I was then freely able to pass a long ball-tipped dissector within the ventral epidural space and underneath the left L5 nerve root indicating good decompression.  Hemostasis was then secured using a combination of morcellized Gelfoam and thrombin and bipolar electrocautery. The wound is irrigated with copious amounts of antibiotic saline irrigation. The nerve root was then covered with a long-acting steroid solution. Self-retaining retractor was then removed, and the wound is closed in layers using a combination of interrupted 0 Vicryl and 3-0 Vicryl stitches. The skin was closed using standard skin glue.  At the end of the case all sponge, needle, and instrument counts were correct. The patient was then transferred to the stretcher and taken to the postanesthesia care unit in stable hemodynamic condition.

## 2019-02-21 NOTE — Transfer of Care (Signed)
Immediate Anesthesia Transfer of Care Note  Patient: Sherri White  Procedure(s) Performed: MICRODISCECTOMY LUMBAR FOUR- LUMBAR FIVE (N/A Spine Lumbar)  Patient Location: PACU  Anesthesia Type:General  Level of Consciousness: awake, alert  and oriented  Airway & Oxygen Therapy: Patient Spontanous Breathing and Patient connected to face mask oxygen  Post-op Assessment: Report given to RN, Post -op Vital signs reviewed and stable and Patient moving all extremities X 4  Post vital signs: Reviewed and stable  Last Vitals:  Vitals Value Taken Time  BP    Temp    Pulse    Resp    SpO2      Last Pain:  Vitals:   02/21/19 1009  TempSrc:   PainSc: 3       Patients Stated Pain Goal: 3 (16/57/90 [id number])  Complications: No apparent anesthesia complications

## 2019-02-22 ENCOUNTER — Encounter (HOSPITAL_COMMUNITY): Payer: Self-pay | Admitting: Neurosurgery

## 2020-05-24 IMAGING — CR DG LUMBAR SPINE 2-3V
2 series · 2 of 2 positions shown · non-contrast
Comparison: None

CLINICAL DATA: 47-year-old female with micro discectomy at L4-5.

EXAM:
LUMBAR SPINE - 2-3 VIEW

[lateral (1 of 2)]
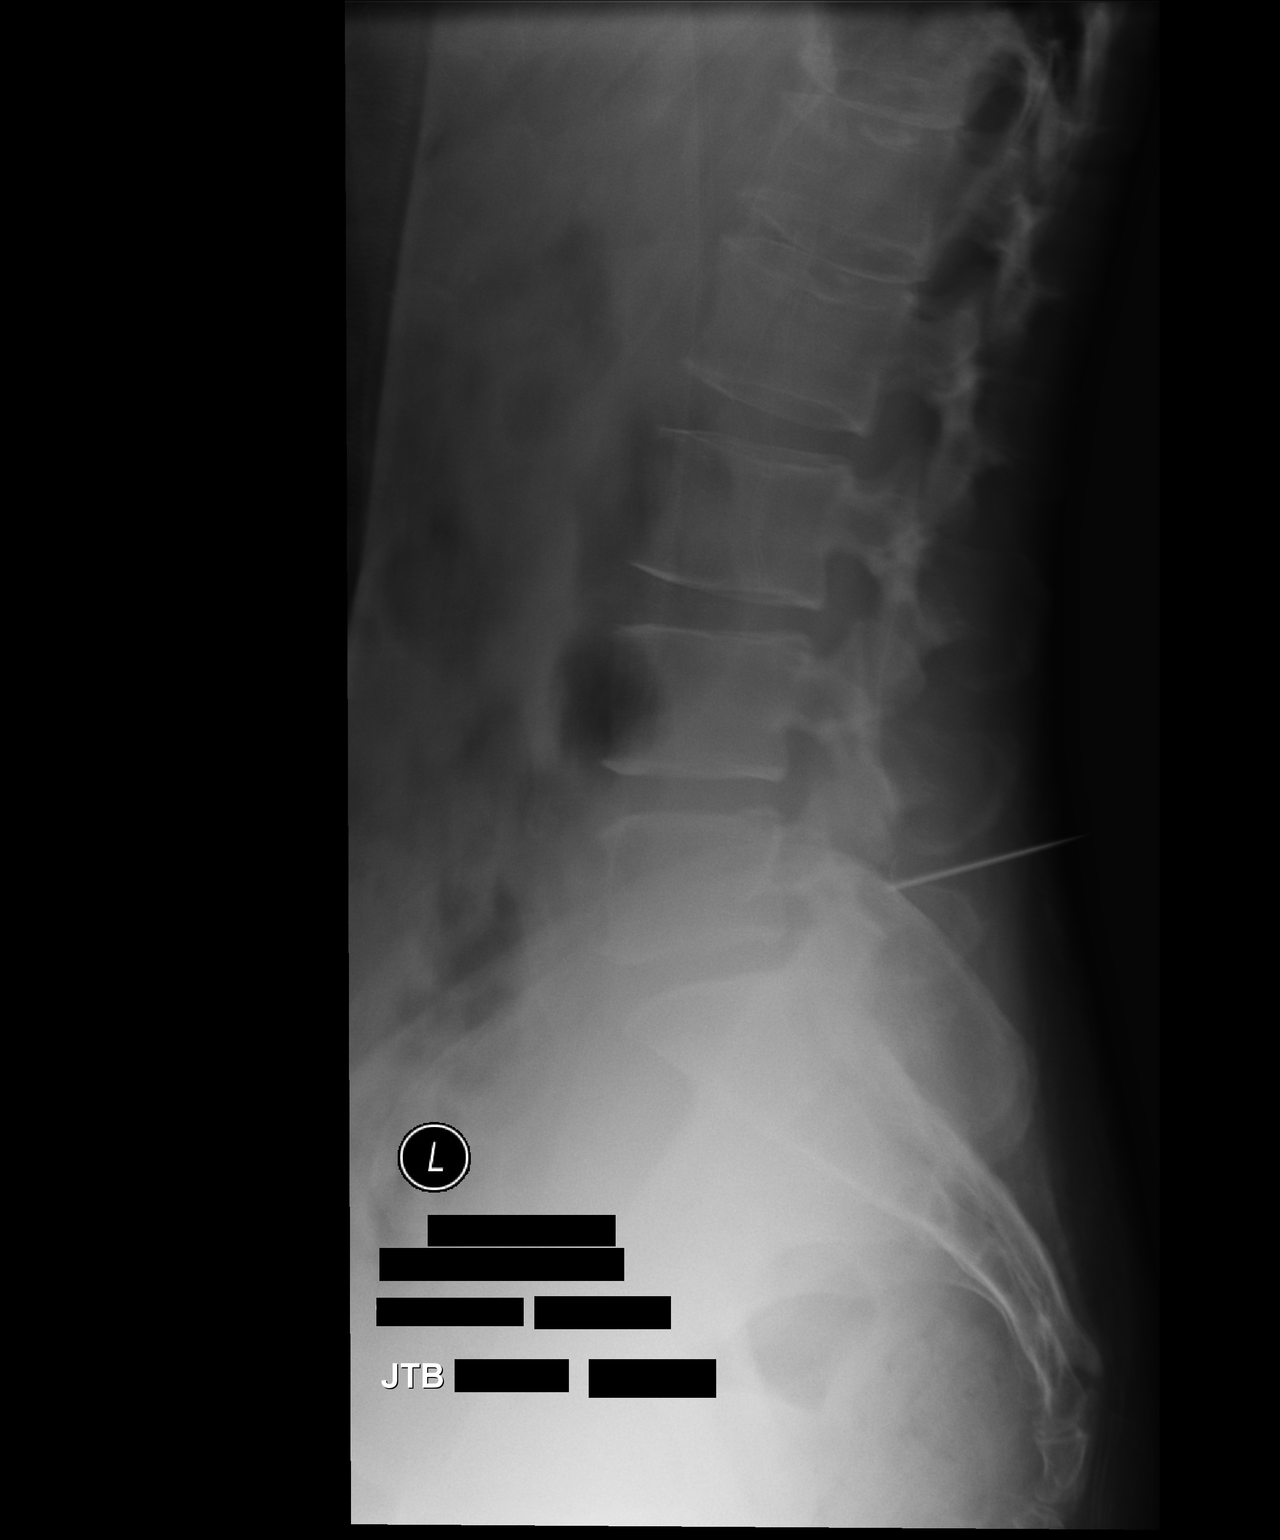

[lateral (2 of 2)]
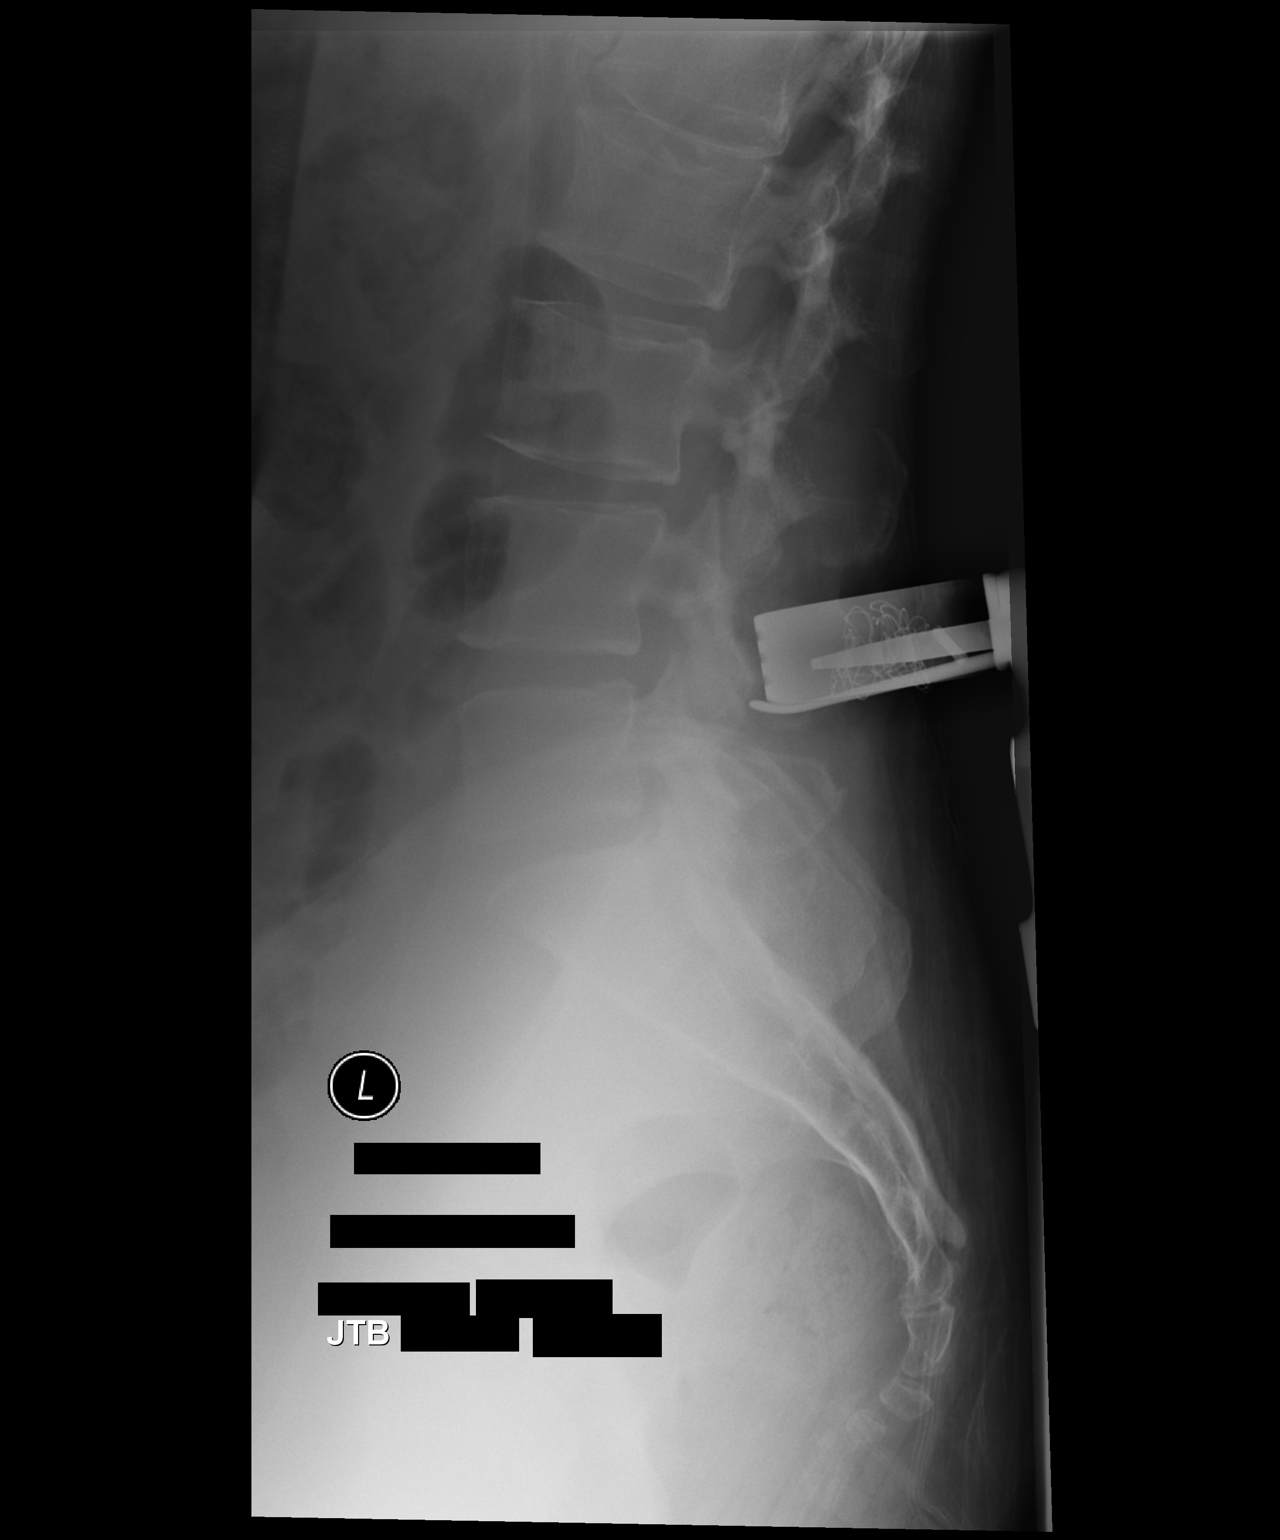

[2 of 2 positions shown; findings below may reference images not displayed]

FINDINGS: A radiopaque needle is directed with tip at the level of L5 inferior
articular process.
IMPRESSION: Intraoperative localization as above.

## 2024-06-06 ENCOUNTER — Other Ambulatory Visit (HOSPITAL_BASED_OUTPATIENT_CLINIC_OR_DEPARTMENT_OTHER): Payer: Self-pay

## 2024-06-06 MED ORDER — AMOXICILLIN-POT CLAVULANATE 875-125 MG PO TABS
1.0000 | ORAL_TABLET | Freq: Two times a day (BID) | ORAL | 0 refills | Status: AC
  Filled 2024-06-06: qty 14, 7d supply, fill #0

## 2024-06-28 ENCOUNTER — Encounter (HOSPITAL_BASED_OUTPATIENT_CLINIC_OR_DEPARTMENT_OTHER): Payer: Self-pay

## 2024-06-28 ENCOUNTER — Other Ambulatory Visit (HOSPITAL_BASED_OUTPATIENT_CLINIC_OR_DEPARTMENT_OTHER): Payer: Self-pay

## 2024-06-28 DIAGNOSIS — Z1231 Encounter for screening mammogram for malignant neoplasm of breast: Secondary | ICD-10-CM

## 2024-07-05 ENCOUNTER — Encounter (HOSPITAL_BASED_OUTPATIENT_CLINIC_OR_DEPARTMENT_OTHER): Payer: Self-pay | Admitting: Radiology

## 2024-07-05 ENCOUNTER — Inpatient Hospital Stay (HOSPITAL_BASED_OUTPATIENT_CLINIC_OR_DEPARTMENT_OTHER): Admission: RE | Admit: 2024-07-05 | Discharge: 2024-07-05 | Disposition: A | Source: Ambulatory Visit

## 2024-07-05 DIAGNOSIS — Z1231 Encounter for screening mammogram for malignant neoplasm of breast: Secondary | ICD-10-CM
